# Patient Record
Sex: Male | Born: 1995 | Race: Black or African American | Hispanic: No | Marital: Single | Smoking: Never smoker
Health system: Southern US, Community
[De-identification: ages and names within clinical notes are randomized; demographics above are authoritative.]

---
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
## 2015-05-25 ENCOUNTER — Emergency Department (INDEPENDENT_AMBULATORY_CARE_PROVIDER_SITE_OTHER)
Admission: EM | Admit: 2015-05-25 | Discharge: 2015-05-25 | Disposition: A | Discharge status: Home / Self Care | Payer: Medicaid Other | Source: Home / Self Care | Attending: Emergency Medicine | Admitting: Emergency Medicine

## 2015-05-25 ENCOUNTER — Encounter (HOSPITAL_COMMUNITY): Payer: Self-pay | Admitting: Emergency Medicine

## 2015-05-25 DIAGNOSIS — S29019A Strain of muscle and tendon of unspecified wall of thorax, initial encounter: Secondary | ICD-10-CM

## 2015-05-25 DIAGNOSIS — S29009A Unspecified injury of muscle and tendon of unspecified wall of thorax, initial encounter: Secondary | ICD-10-CM

## 2015-05-25 NOTE — ED Provider Notes (Signed)
CSN: [id number]     Arrival date & time 05/25/15  1436 History   First MD Initiated Contact with Patient 05/25/15 1638     Chief Complaint  Patient presents with  . Back Pain   (Consider location/radiation/quality/duration/timing/severity/associated sxs/prior Treatment) HPI Comments: 20 year old male was getting out of his car last PM when he felt an acute pain in his left parathoracic musculature. Pain is worse with certain movements. He has no trouble breathing. He is able to flex forward without pain however when he flexes his spine laterally feels full in the muscle have his right back. Denies distal paresthesias or motor weakness. Denies blunt trauma or fall. Denies spinal pain.  Patient is a 20 y.o. male presenting with back pain.  Back Pain Associated symptoms: no headaches, no numbness and no weakness     History reviewed. No pertinent past medical history. History reviewed. No pertinent past surgical history. No family history on file. Social History  Substance Use Topics  . Smoking status: None  . Smokeless tobacco: None  . Alcohol Use: None    Review of Systems  Constitutional: Negative.   Respiratory: Negative.   Gastrointestinal: Negative.   Genitourinary: Negative.   Musculoskeletal: Positive for back pain.       As per HPI  Skin: Negative.   Neurological: Negative for dizziness, weakness, numbness and headaches.    Allergies  Review of patient's allergies indicates no known allergies.  Home Medications   Prior to Admission medications   Not on File   Meds Ordered and Administered this Visit  Medications - No data to display  BP 129/91 mmHg  Pulse 67  Temp(Src) 98.5 F (36.9 C) (Oral)  Resp 16  SpO2 99% No data found.   Physical Exam  Constitutional: He is oriented to person, place, and time. He appears well-developed and well-nourished.  HENT:  Head: Normocephalic and atraumatic.  Eyes: EOM are normal. Left eye exhibits no discharge.  Neck:  Normal range of motion. Neck supple.  Musculoskeletal: Normal range of motion. He exhibits no edema.  Minor tenderness to the right lower parathoracic musculature. No spinal tenderness. No deformity or discoloration. Patient is able to flex forward beyond 90. He is able to stand straight and flex bilaterally however there is mild pain associated with right lateral flexion of the spine.   Neurological: He is alert and oriented to person, place, and time. No cranial nerve deficit.  Skin: Skin is warm and dry.  Psychiatric: He has a normal mood and affect.  Nursing note and vitals reviewed.   ED Course  Procedures (including critical care time)  Labs Review Labs Reviewed - No data to display  Imaging Review No results found.   Visual Acuity Review  Right Eye Distance:   Left Eye Distance:   Bilateral Distance:    Right Eye Near:   Left Eye Near:    Bilateral Near:         MDM   1. Thoracic myofascial strain, initial encounter    After 1-2 days of using cold compresses start using heating pad and then start performing stretches as demonstrated. No heavy lifting or bending or twisting for the next 3-4 days.     Hayden Rasmussen, NP 05/25/15 1742

## 2015-05-25 NOTE — Discharge Instructions (Signed)
Thoracic Strain After 1-2 days of using cold compresses start using heating pad and then start performing stretches as demonstrated. No heavy lifting or bending or twisting for the next 3-4 days. A thoracic strain, which is sometimes called a mid-back strain, is an injury to the muscles or tendons that attach to the upper part of your back behind your chest. This type of injury occurs when a muscle is overstretched or overloaded.  Thoracic strains can range from mild to severe. Mild strains may involve stretching a muscle or tendon without tearing it. These injuries may heal in 1-2 weeks. More severe strains involve tearing of muscle fibers or tendons. These will cause more pain and may take 6-8 weeks to heal. CAUSES This condition may be caused by:  An injury in which a sudden force is placed on the muscle.  Exercising without properly warming up.  Overuse of the muscle.  Improper form during certain movements.  Other injuries that surround or cause stress on the mid-back, causing a strain on the muscles. In some cases, the cause may not be known. RISK FACTORS This injury is more common in:  Athletes.  People with obesity. SYMPTOMS The main symptom of this condition is pain, especially with movement. Other symptoms include:  Bruising.  Swelling.  Spasm. DIAGNOSIS This condition may be diagnosed with a physical exam. X-rays may be taken to check for a fracture. TREATMENT This condition may be treated with:  Resting and icing the injured area.  Physical therapy. This will involve doing stretching and strengthening exercises.  Medicines for pain and inflammation. HOME CARE INSTRUCTIONS  Rest as needed. Follow instructions from your health care provider about any restrictions on activity.  If directed, apply ice to the injured area:  Put ice in a plastic bag.  Place a towel between your skin and the bag.  Leave the ice on for 20 minutes, 2-3 times per day.  Take  over-the-counter and prescription medicines only as told by your health care provider.  Begin doing exercises as told by your health care provider or physical therapist.  Always warm up properly before physical activity or sports.  Bend your knees before you lift heavy objects.  Keep all follow-up visits as told by your health care provider. This is important. SEEK MEDICAL CARE IF:  Your pain is not helped by medicine.  Your pain, bruising, or swelling is getting worse.  You have a fever. SEEK IMMEDIATE MEDICAL CARE IF:  You have shortness of breath.  You have chest pain.  You develop numbness or weakness in your legs.  You have involuntary loss of urine (urinary incontinence).   This information is not intended to replace advice given to you by your health care provider. Make sure you discuss any questions you have with your health care provider.   Document Released: 06/03/2003 Document Revised: 12/02/2014 Document Reviewed: 05/07/2014 Elsevier Interactive Patient Education [employer]
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
## 2015-11-19 ENCOUNTER — Encounter (HOSPITAL_COMMUNITY): Payer: Self-pay | Admitting: Emergency Medicine

## 2015-11-19 ENCOUNTER — Emergency Department (HOSPITAL_COMMUNITY)
Admission: EM | Admit: 2015-11-19 | Discharge: 2015-11-19 | Disposition: A | Discharge status: Home / Self Care | Payer: Medicaid Other | Attending: Emergency Medicine | Admitting: Emergency Medicine

## 2015-11-19 DIAGNOSIS — J029 Acute pharyngitis, unspecified: Secondary | ICD-10-CM

## 2015-11-19 LAB — RAPID STREP SCREEN (MED CTR MEBANE ONLY): Streptococcus, Group A Screen (Direct): NEGATIVE

## 2015-11-19 NOTE — ED Notes (Signed)
Pt stable, ambulatory, states understanding of discharge instructions 

## 2015-11-19 NOTE — Discharge Instructions (Signed)
Read the information below.   Your rapid strep was negative. It is being sent for culture. If positive you will be notified and placed on antibiotics. You may have a viral pharyngitis.  Please stay hydrated. You can take tylenol 650mg  every 6hrs or motrin 400mg  every 6hrs for pain relief. You can gargle with warm salt water.  Follow up with your primary provider if your symptoms persist.  You may return to the Emergency Department at any time for worsening condition or any new symptoms that concern you. Return to the ED if you develop fever, have a change in your voice, are unable to swallow, are unable to open your mouth, or have difficulty breathing.

## 2015-11-19 NOTE — ED Provider Notes (Signed)
[facility]EM[facility] Provider Note   CSN: [id number] Arrival date & time: 11/19/15  2032  By signing my name below, I, Linna Darner, attest that this documentation has been prepared under the direction and in the presence of Arvilla Meres, PA-C. Electronically Signed: Linna Darner, Scribe. 11/19/2015. 9:55 PM.  History   Chief Complaint Chief Complaint  Patient presents with  . Sore Throat    The history is provided by the patient. No language interpreter was used.     HPI Comments: Derek Medina is a 20 y.o. male who presents to the Emergency Department complaining of sudden onset, constant, sore throat beginning yesterday. Pt believes he may have strep throat; he states he sleeps in front of his fan at night. He states he noticed his tonsils were swollen and his left tonsil was red recently. Pt endorses sore throat exacerbation with swallowing. He also notes a dry cough ongoing for the last several days and reports he has been coughing more at night. Pt has used cough medicine with no relief of his symptoms. He has no known allergies to medication. He works as a Public affairs consultant. He denies trouble swallowing, SOB, fever, nasal congestion, sinus pressure, eye discharge, headache, ear pain, neck pain, myalgias, wheezing, chest pain, fatigue, rash, nausea, vomiting, or any other associated symptoms.  History reviewed. No pertinent past medical history.  There are no active problems to display for this patient.   History reviewed. No pertinent surgical history.     Home Medications    Prior to Admission medications   Not on File    Family History No family history on file.  Social History Social History  Substance Use Topics  . Smoking status: Never Smoker  . Smokeless tobacco: Never Used  . Alcohol use No     Allergies   Review of patient's allergies indicates no known allergies.   Review of Systems Review of Systems  Constitutional: Negative for fatigue and fever.   HENT: Positive for sore throat. Negative for congestion, ear pain, sinus pressure and trouble swallowing.   Eyes: Negative for discharge.  Respiratory: Positive for cough (dry). Negative for shortness of breath and wheezing.   Cardiovascular: Negative for chest pain.  Gastrointestinal: Negative for nausea and vomiting.  Musculoskeletal: Negative for myalgias and neck pain.  Skin: Negative for rash.  Neurological: Negative for headaches.    Physical Exam Updated Vital Signs BP 136/73 (BP Location: Left Arm)   Pulse 99   Temp 99.1 F (37.3 C) (Oral)   Resp 18   Ht 5\' 10"  (1.778 m)   Wt 110 kg   SpO2 99%   BMI 34.80 kg/m   Physical Exam  Constitutional: He is oriented to person, place, and time. He appears well-developed and well-nourished. No distress.  HENT:  Head: Normocephalic and atraumatic.  Right Ear: Tympanic membrane, external ear and ear canal normal.  Left Ear: Tympanic membrane, external ear and ear canal normal.  Mouth/Throat: Uvula is midline and mucous membranes are normal. No trismus in the jaw. Posterior oropharyngeal erythema present. No oropharyngeal exudate, posterior oropharyngeal edema or tonsillar abscesses. Tonsils are 2+ on the right. Tonsils are 2+ on the left. Tonsillar exudate.  No trismus. No uvula swelling. Uvula is midline. Posterior erythema. Tonsillar hypertrophy and erythema with exudate. Tenderness to left cervical lymph node. No soft palate swelling.   Eyes: Conjunctivae and EOM are normal. Pupils are equal, round, and reactive to light. Right eye exhibits no discharge. Left eye exhibits no discharge. No  scleral icterus.  Neck: Normal range of motion and phonation normal. Neck supple. No neck rigidity. No tracheal deviation and normal range of motion present.  Cardiovascular: Normal rate, regular rhythm, normal heart sounds and intact distal pulses.   Pulmonary/Chest: Effort normal and breath sounds normal. No stridor. No respiratory distress. He  has no wheezes. He has no rales.  Abdominal: He exhibits no distension.  Musculoskeletal: Normal range of motion.  Lymphadenopathy:    He has cervical adenopathy.  Neurological: He is alert and oriented to person, place, and time.  Skin: Skin is warm and dry.  Psychiatric: He has a normal mood and affect. His behavior is normal.  Nursing note and vitals reviewed.   ED Treatments / Results  Labs (all labs ordered are listed, but only abnormal results are displayed) Labs Reviewed  RAPID STREP SCREEN (NOT AT [facility])  CULTURE, GROUP A STREP [facility])    EKG  EKG Interpretation None       Radiology No results found.  Procedures Procedures (including critical care time)  DIAGNOSTIC STUDIES: Oxygen Saturation is 100% on RA, normal by my interpretation.    COORDINATION OF CARE: 9:55 PM Discussed treatment plan with pt at bedside and pt agreed to plan.  Medications Ordered in ED Medications - No data to display   Initial Impression / Assessment and Plan / ED Course  I have reviewed the triage vital signs and the nursing notes.  Pertinent labs & imaging results that were available during my care of the patient were reviewed by me and considered in my medical decision making (see chart for details).  Clinical Course    Patient presents to ED with complaint of sore throat. Patient is afebrile and non-toxic appearing in NAD. Vital signs are stable. Physical exam remarkable for erythematous posterior pharynx and tonsillar hypertrophy with exudate. No soft palate swelling. No trismus. Phonation is normal. No stridor. Uvula is midline. Patient is managing oral secretions. Neck ROM intact. Low suspicion for PTA or soft tissue infection. Rapid strep negative, will culture. Centor score 2. ?viral pharyngitis. Discussed symptomatic management to include pain management, hydration, and gargle with warm salt water. Encouraged follow up with PCP if sxs persist. Return precautions given. Patient  voiced understanding and is agreeable.    I personally performed the services described in this documentation, which was scribed in my presence. The recorded information has been reviewed and is accurate.   Final Clinical Impressions(s) / ED Diagnoses   Final diagnoses:  Pharyngitis    New Prescriptions There are no discharge medications for this patient.    Lona KettleAshley Laurel Soul Deveney, [place] JerseyPA-C 11/20/15 [phone]

## 2015-11-19 NOTE — ED Triage Notes (Signed)
C/o sore throat x 2 days with non-productive cough at night when lying down.

## 2015-11-20 NOTE — ED Provider Notes (Signed)
Medical screening examination/treatment/procedure(s) were performed by non-physician practitioner and as supervising physician I was immediately available for consultation/collaboration.     Linwood DibblesJon Maicee Ullman, MD 11/20/15 2236

## 2015-11-22 LAB — CULTURE, GROUP A STREP (THRC)

## 2017-12-31 ENCOUNTER — Encounter (HOSPITAL_COMMUNITY): Payer: Self-pay | Admitting: Emergency Medicine

## 2017-12-31 ENCOUNTER — Emergency Department (HOSPITAL_COMMUNITY)
Admission: EM | Admit: 2017-12-31 | Discharge: 2017-12-31 | Disposition: A | Discharge status: Home / Self Care | Payer: Self-pay | Attending: Emergency Medicine | Admitting: Emergency Medicine

## 2017-12-31 DIAGNOSIS — W1789XA Other fall from one level to another, initial encounter: Secondary | ICD-10-CM | POA: Insufficient documentation

## 2017-12-31 DIAGNOSIS — S7011XA Contusion of right thigh, initial encounter: Secondary | ICD-10-CM | POA: Insufficient documentation

## 2017-12-31 DIAGNOSIS — S80811A Abrasion, right lower leg, initial encounter: Secondary | ICD-10-CM | POA: Insufficient documentation

## 2017-12-31 DIAGNOSIS — Y999 Unspecified external cause status: Secondary | ICD-10-CM | POA: Insufficient documentation

## 2017-12-31 DIAGNOSIS — S8011XA Contusion of right lower leg, initial encounter: Secondary | ICD-10-CM

## 2017-12-31 DIAGNOSIS — Y9289 Other specified places as the place of occurrence of the external cause: Secondary | ICD-10-CM | POA: Insufficient documentation

## 2017-12-31 DIAGNOSIS — Y9355 Activity, bike riding: Secondary | ICD-10-CM | POA: Insufficient documentation
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
## 2017-12-31 NOTE — Discharge Instructions (Addendum)
Local wound care with bacitracin and dressing changes to your abrasions twice daily.  Apply heating pad to the bruised area of your thigh.  Return to the emergency department for increased pain, fever, redness, or other new and concerning symptoms.

## 2018-07-18 ENCOUNTER — Ambulatory Visit (HOSPITAL_COMMUNITY)
Admission: EM | Admit: 2018-07-18 | Discharge: 2018-07-18 | Disposition: A | Discharge status: Home / Self Care | Payer: Self-pay | Attending: Internal Medicine | Admitting: Internal Medicine

## 2018-07-18 ENCOUNTER — Other Ambulatory Visit: Payer: Self-pay

## 2018-07-18 ENCOUNTER — Encounter (HOSPITAL_COMMUNITY): Payer: Self-pay

## 2018-07-18 DIAGNOSIS — N481 Balanitis: Secondary | ICD-10-CM

## 2018-07-18 DIAGNOSIS — R21 Rash and other nonspecific skin eruption: Secondary | ICD-10-CM

## 2018-07-18 MED ORDER — CLOTRIMAZOLE 1 % EX CREA: TOPICAL_CREAM | CUTANEOUS | 0 refills | Status: AC

## 2018-07-18 NOTE — ED Triage Notes (Signed)
describes rash on penis, denies discharge or painful irritation . Denies unprotected sex

## 2018-07-18 NOTE — ED Provider Notes (Addendum)
[facility]    CSN: [id number] Arrival date & time: 07/18/18  1246     History   Chief Complaint Chief Complaint  Patient presents with  . Rash    HPI Derek Medina is a 23 y.o. male with no past medical history comes to urgent care with complaints of rash and increased sensitivity over the distal end of his penis.  Symptoms started a few weeks ago and has been persistent.  He denies any penile discharge.  No scrotal pain or swelling.  Patient has 1 sexual partner. No history of herpetic rash History of herpes.  HPI  History reviewed. No pertinent past medical history.  There are no active problems to display for this patient.   History reviewed. No pertinent surgical history.     Home Medications    Prior to Admission medications   Medication Sig Start Date End Date Taking? Authorizing Provider  clotrimazole (LOTRIMIN) 1 % cream Apply to affected area 2 times daily 07/18/18   , Britta Mccreedy, MD    Family History History reviewed. No pertinent family history.  Social History Social History   Tobacco Use  . Smoking status: Never Smoker  . Smokeless tobacco: Never Used  Substance Use Topics  . Alcohol use: No  . Drug use: No     Allergies   Patient has no known allergies.   Review of Systems Review of Systems  Constitutional: Negative.   HENT: Negative.   Eyes: Negative.   Respiratory: Negative.   Cardiovascular: Negative.   Gastrointestinal: Negative.   Genitourinary: Positive for genital sores. Negative for discharge, dysuria, enuresis, frequency, penile pain, penile swelling, scrotal swelling, testicular pain and urgency.  Musculoskeletal: Negative.   Allergic/Immunologic: Negative.   Hematological: Negative.      Physical Exam Triage Vital Signs ED Triage Vitals  Enc Vitals Group     BP 07/18/18 1317 134/90     Pulse Rate 07/18/18 1306 99     Resp 07/18/18 1306 18     Temp 07/18/18 1306 98.2 F (36.8 C)     Temp src --       SpO2 07/18/18 1306 100 %     Weight --      Height --      Head Circumference --      Peak Flow --      Pain Score 07/18/18 1310 0     Pain Loc --      Pain Edu? --      Excl. in GC? --    No data found.  Updated Vital Signs BP 134/90   Pulse 99   Temp 98.2 F (36.8 C)   Resp 18   SpO2 100%   Visual Acuity Right Eye Distance:   Left Eye Distance:   Bilateral Distance:    Right Eye Near:   Left Eye Near:    Bilateral Near:     Physical Exam Vitals signs reviewed.  Constitutional:      General: He is not in acute distress.    Appearance: Normal appearance. He is not ill-appearing.  Neck:     Musculoskeletal: Normal range of motion. No neck rigidity.  Cardiovascular:     Rate and Rhythm: Normal rate and regular rhythm.  Genitourinary:    Penis: Normal.      Scrotum/Testes: Normal.     Comments: Head of the penis is slightly irritated.  No obvious rash.  No erythema.  No penile discharge.  No groin tenderness.  Lymphadenopathy:     Cervical: No cervical adenopathy.  Neurological:     Mental Status: He is alert.      UC Treatments / Results  Labs (all labs ordered are listed, but only abnormal results are displayed) Labs Reviewed - No data to display  EKG None  Radiology No results found.  Procedures Procedures (including critical care time)  Medications Ordered in UC Medications - No data to display  Initial Impression / Assessment and Plan / UC Course  I have reviewed the triage vital signs and the nursing notes.  Pertinent labs & imaging results that were available during my care of the patient were reviewed by me and considered in my medical decision making (see chart for details).     1.  Balanitis: Hygiene strategies discussed with the patient Clotrimazole 1% cream to be applied twice daily for 1- 2 weeks  Final Clinical Impressions(s) / UC Diagnoses   Final diagnoses:  Rash  Balanitis   Discharge Instructions   None    ED  Prescriptions    Medication Sig Dispense Auth. Provider   clotrimazole (LOTRIMIN) 1 % cream Apply to affected area 2 times daily 15 g , Britta Mccreedy O, MD     Controlled Substance Prescriptions [place] Controlled Substance Registry consulted? No   Merrilee Jansky,  O, MD 07/18/18 1337    Merrilee Jansky,  O, MD 07/18/18 1346
